# Patient Record
Sex: Female | Born: 1935 | Race: Black or African American | Hispanic: No | Smoking: Former smoker
Health system: Southern US, Community
[De-identification: ages and names within clinical notes are randomized; demographics above are authoritative.]

## PROBLEM LIST (undated history)

## (undated) DIAGNOSIS — H9190 Unspecified hearing loss, unspecified ear: Secondary | ICD-10-CM

## (undated) DIAGNOSIS — I10 Essential (primary) hypertension: Secondary | ICD-10-CM

## (undated) DIAGNOSIS — R7303 Prediabetes: Secondary | ICD-10-CM

## (undated) DIAGNOSIS — F0390 Unspecified dementia without behavioral disturbance: Secondary | ICD-10-CM

## (undated) DIAGNOSIS — H547 Unspecified visual loss: Secondary | ICD-10-CM

## (undated) HISTORY — PX: OTHER SURGICAL HISTORY: SHX169

## (undated) HISTORY — PX: KNEE SURGERY: SHX244

## (undated) HISTORY — PX: ABDOMINAL HYSTERECTOMY: SHX81

## (undated) HISTORY — PX: CARPAL TUNNEL RELEASE: SHX101

---

## 2018-06-21 ENCOUNTER — Emergency Department (HOSPITAL_BASED_OUTPATIENT_CLINIC_OR_DEPARTMENT_OTHER): Payer: Medicare Other

## 2018-06-21 ENCOUNTER — Encounter (HOSPITAL_BASED_OUTPATIENT_CLINIC_OR_DEPARTMENT_OTHER): Payer: Self-pay

## 2018-06-21 ENCOUNTER — Other Ambulatory Visit: Payer: Self-pay

## 2018-06-21 ENCOUNTER — Emergency Department (HOSPITAL_BASED_OUTPATIENT_CLINIC_OR_DEPARTMENT_OTHER)
Admission: EM | Admit: 2018-06-21 | Discharge: 2018-06-22 | Disposition: A | Discharge status: Home / Self Care | Payer: Medicare Other | Attending: Emergency Medicine | Admitting: Emergency Medicine

## 2018-06-21 DIAGNOSIS — Z87891 Personal history of nicotine dependence: Secondary | ICD-10-CM | POA: Diagnosis not present

## 2018-06-21 DIAGNOSIS — S43005A Unspecified dislocation of left shoulder joint, initial encounter: Secondary | ICD-10-CM

## 2018-06-21 DIAGNOSIS — F0390 Unspecified dementia without behavioral disturbance: Secondary | ICD-10-CM | POA: Diagnosis not present

## 2018-06-21 DIAGNOSIS — Y939 Activity, unspecified: Secondary | ICD-10-CM | POA: Diagnosis not present

## 2018-06-21 DIAGNOSIS — S4982XA Other specified injuries of left shoulder and upper arm, initial encounter: Secondary | ICD-10-CM | POA: Diagnosis present

## 2018-06-21 DIAGNOSIS — Y929 Unspecified place or not applicable: Secondary | ICD-10-CM | POA: Insufficient documentation

## 2018-06-21 DIAGNOSIS — Y999 Unspecified external cause status: Secondary | ICD-10-CM | POA: Insufficient documentation

## 2018-06-21 DIAGNOSIS — W1830XA Fall on same level, unspecified, initial encounter: Secondary | ICD-10-CM | POA: Diagnosis not present

## 2018-06-21 DIAGNOSIS — I10 Essential (primary) hypertension: Secondary | ICD-10-CM | POA: Diagnosis not present

## 2018-06-21 HISTORY — DX: Essential (primary) hypertension: I10

## 2018-06-21 HISTORY — DX: Unspecified visual loss: H54.7

## 2018-06-21 HISTORY — DX: Unspecified dementia, unspecified severity, without behavioral disturbance, psychotic disturbance, mood disturbance, and anxiety: F03.90

## 2018-06-21 HISTORY — DX: Unspecified hearing loss, unspecified ear: H91.90

## 2018-06-21 MED ORDER — ETOMIDATE 2 MG/ML IV SOLN
10.0000 mg | Freq: Once | INTRAVENOUS | Status: AC
  Administered 2018-06-21: 10 mg via INTRAVENOUS
  Filled 2018-06-21: qty 10

## 2018-06-21 MED ORDER — MIDAZOLAM HCL 2 MG/2ML IJ SOLN
1.0000 mg | Freq: Once | INTRAMUSCULAR | Status: DC
  Filled 2018-06-21: qty 2

## 2018-06-21 NOTE — ED Provider Notes (Signed)
[facility] EMERGENCY DEPARTMENT Provider Note   CSN: [id number] Arrival date & time: 06/21/18  1959     History   Chief Complaint Chief Complaint  Patient presents with  . Shoulder Injury    HPI Misty Ray is a 84 y.o. female.  Pt presents to the ED today from urgent care with a dislocated left shoulder.  The pt has dementia and is nonverbal.  History per daughter.  Pt fell around 0500 today.  The daughter noticed that pt was not moving the shoulder like normal and went to UC.  They took Xrays and attempted reduction but were unsuccessful.  So, they sent her here.     Past Medical History:  Diagnosis Date  . Blind   . Dementia   . Hearing loss   . Hypertension     There are no active problems to display for this patient.   Past Surgical History:  Procedure Laterality Date  . ABDOMINAL HYSTERECTOMY    . hip sugery    . KNEE SURGERY       OB History   None      Home Medications    Prior to Admission medications   Not on File    Family History No family history on file.  Social History Social History   Tobacco Use  . Smoking status: Former Games developer  . Smokeless tobacco: Never Used  Substance Use Topics  . Alcohol use: Never    Frequency: Never  . Drug use: Never     Allergies   Cortisone   Review of Systems Review of Systems  Musculoskeletal:       Left shoulder  All other systems reviewed and are negative.    Physical Exam Updated Vital Signs BP (!) 215/95 (BP Location: Right Arm)   Pulse 80   Temp 98.5 F (36.9 C) (Oral)   Resp 20   Ht 4\' 9"  (1.448 m)   Wt 49.9 kg (110 lb)   SpO2 100%   BMI 23.80 kg/m   Physical Exam  Constitutional: She appears well-developed and well-nourished.  HENT:  Head: Normocephalic and atraumatic.  Right Ear: External ear normal.  Left Ear: External ear normal.  Nose: Nose normal.  Mouth/Throat: Oropharynx is clear and moist.  Eyes: Pupils are equal, round, and reactive to  light. Conjunctivae and EOM are normal.  Neck: Normal range of motion. Neck supple.  Cardiovascular: Normal rate, regular rhythm, normal heart sounds and intact distal pulses.  Pulmonary/Chest: Effort normal and breath sounds normal.  Abdominal: Soft. Bowel sounds are normal.  Musculoskeletal:       Left shoulder: She exhibits decreased range of motion.  Neurological: She is alert.  Skin: Skin is warm. Capillary refill takes less than 2 seconds.  Psychiatric: She has a normal mood and affect.  Nursing note and vitals reviewed.    ED Treatments / Results  Labs (all labs ordered are listed, but only abnormal results are displayed) Labs Reviewed - No data to display  EKG None  Radiology Dg Shoulder Left  Result Date: 06/21/2018 CLINICAL DATA:  Left shoulder dislocation after fall today. Alzheimer's disease EXAM: LEFT SHOULDER - 2+ VIEW COMPARISON:  None. FINDINGS: There is anterior inferior dislocation of the right humeral head with suspected glenoid. Do not see a definite bony Bankart fracture although there may well be a mild Hill-Sachs impaction. Spurring of the humeral head observed. Atherosclerotic calcification of the aortic arch. IMPRESSION: 1. Anterior inferior dislocation of the left shoulder. Possible  Hill-Sachs impaction along the humeral head. 2.  Aortic Atherosclerosis (ICD10-I70.0). Electronically Signed   By: Gaylyn RongWalter  Liebkemann M.D.   On: 06/21/2018 20:52   Dg Shoulder Left Portable  Result Date: 06/21/2018 CLINICAL DATA:  Reduction of left shoulder. EXAM: LEFT SHOULDER - 1 VIEW COMPARISON:  06/21/2018 FINDINGS: Anterior subcoracoid humeral head dislocation is still present. No bony Bankart lesion. Stable appearance of the [facility] joint without dislocation. Atherosclerosis the adjacent aortic arch with ectasia. No acute osseous abnormality of the included ribs. IMPRESSION: Redemonstration of anterior subcoracoid humeral head dislocation after reduction attempt. Electronically  Signed   By: Tollie Ethavid  Kwon M.D.   On: 06/21/2018 22:25    Procedures .Sedation Date/Time: 06/21/2018 10:55 PM Performed by: Jacalyn LefevreHaviland, Omaree Fuqua, MD Authorized by: Jacalyn LefevreHaviland, Chanele Douglas, MD   Consent:    Consent obtained:  Written   Consent given by:  Guardian   Risks discussed:  Allergic reaction, prolonged hypoxia resulting in organ damage, dysrhythmia, prolonged sedation necessitating reversal, inadequate sedation, respiratory compromise necessitating ventilatory assistance and intubation, nausea and vomiting   Alternatives discussed:  Analgesia without sedation Universal protocol:    Immediately prior to procedure a time out was called: yes     Patient identity confirmation method:  Arm band Indications:    Procedure performed:  Dislocation reduction   Procedure necessitating sedation performed by:  Physician performing sedation   Intended level of sedation:  Moderate (conscious sedation) Pre-sedation assessment:    Time since last food or drink:  5   ASA classification: class 2 - patient with mild systemic disease     Mallampati score:  I - soft palate, uvula, fauces, pillars visible   Pre-sedation assessments completed and reviewed: airway patency   Immediate pre-procedure details:    Reassessment: Patient reassessed immediately prior to procedure     Reviewed: vital signs     Verified: bag valve mask available, emergency equipment available, intubation equipment available, IV patency confirmed, oxygen available, reversal medications available and suction available   Procedure details (see MAR for exact dosages):    Preoxygenation:  Nasal cannula   Sedation:  Etomidate   Intra-procedure monitoring:  Blood pressure monitoring, cardiac monitor, continuous capnometry, continuous pulse oximetry, frequent LOC assessments and frequent vital sign checks   Intra-procedure events: none     Total Provider sedation time (minutes):  30 Post-procedure details:    Post-sedation assessment completed:   06/21/2018 10:56 PM   Attendance: Constant attendance by certified staff until patient recovered     Recovery: Patient returned to pre-procedure baseline     Post-sedation assessments completed and reviewed: airway patency, cardiovascular function, hydration status, mental status, nausea/vomiting, pain level, respiratory function and temperature     Patient is stable for discharge or admission: yes     Patient tolerance:  Tolerated well, no immediate complications Reduction of dislocation Date/Time: 06/21/2018 10:57 PM Performed by: Jacalyn LefevreHaviland, Thayer Embleton, MD Authorized by: Jacalyn LefevreHaviland, Elnoria Livingston, MD  Consent: Written consent obtained. Risks and benefits: risks, benefits and alternatives were discussed Consent given by: guardian Patient identity confirmed: arm band Time out: Immediately prior to procedure a "time out" was called to verify the correct patient, procedure, equipment, support staff and site/side marked as required. Local anesthesia used: no  Anesthesia: Local anesthesia used: no  Sedation: Patient sedated: yes Sedation type: moderate (conscious) sedation Sedatives: etomidate Vitals: Vital signs were monitored during sedation.  Patient tolerance: Patient tolerated the procedure well with no immediate complications Comments: Pt's shoulder would not stay in place.    (including  critical care time)  Medications Ordered in ED Medications  midazolam (VERSED) injection 1 mg (has no administration in time range)  etomidate (AMIDATE) injection 10 mg (10 mg Intravenous Given 06/21/18 2202)     Initial Impression / Assessment and Plan / ED Course  I have reviewed the triage vital signs and the nursing notes.  Pertinent labs & imaging results that were available during my care of the patient were reviewed by me and considered in my medical decision making (see chart for details).  Pt's shoulder has been out since at least 0500.  Maybe before then, but that is unclear.  Pt does not have a  lot of swelling and does not seem to have any pain.  I spoke with Dr. August Saucer (ortho) who will f/u with pt in the office on Friday.  Her daughter is going to talk to other siblings to determine if they are interested in surgery or not.  Pt instructed to return if worse.  Final Clinical Impressions(s) / ED Diagnoses   Final diagnoses:  Shoulder dislocation, left, initial encounter    ED Discharge Orders    None       Jacalyn Lefevre, MD 06/21/18 2300

## 2018-06-21 NOTE — ED Triage Notes (Signed)
Per daughter pt fell approx 5am-[facility]xray done and was notified shoulder is dislocated and advised to come to ED-pt to triage in own w/c-hx of dementia, nonverbal at this time-NAD

## 2018-06-21 NOTE — ED Notes (Signed)
Patient at mental status baseline is nonverbal due to dementia. Patient nonambulatory at baseline.

## 2018-06-22 MED ORDER — ETOMIDATE 2 MG/ML IV SOLN
INTRAVENOUS | Status: AC | PRN
  Administered 2018-06-21: 5 mg via INTRAVENOUS

## 2018-06-22 NOTE — Sedation Documentation (Signed)
Dr Particia NearingHaviland at bedside; per repeat xray left shoulder not reduced; order received for Etomidate 5mg  IVP now; RT called to bedside. Patient to be re-sedated for shoulder reduction.

## 2018-06-22 NOTE — Sedation Documentation (Signed)
Patient is at baseline for aldrete score due to advanced dementia.

## 2018-06-23 ENCOUNTER — Ambulatory Visit (INDEPENDENT_AMBULATORY_CARE_PROVIDER_SITE_OTHER): Payer: Medicare Other | Admitting: Orthopedic Surgery

## 2018-06-23 ENCOUNTER — Encounter (INDEPENDENT_AMBULATORY_CARE_PROVIDER_SITE_OTHER): Payer: Self-pay | Admitting: Orthopedic Surgery

## 2018-06-23 DIAGNOSIS — S43005A Unspecified dislocation of left shoulder joint, initial encounter: Secondary | ICD-10-CM | POA: Diagnosis not present

## 2018-06-23 DIAGNOSIS — S43006A Unspecified dislocation of unspecified shoulder joint, initial encounter: Secondary | ICD-10-CM

## 2018-06-23 NOTE — Progress Notes (Signed)
Office Visit Note   Patient: Gasper SellsJeanette Ledford           Date of Birth: 1925-05-11           MRN: [id number] Visit Date: 06/23/2018 Requested by: Zoila ShutterWoodyear, Wynne E, MD [address] [place], [place] [address] PCP: Zoila ShutterWoodyear, Wynne E, MD  Subjective: Chief Complaint  Patient presents with  . Left Shoulder - Pain    HPI: Para MarchJeanette is a patient with left shoulder pain.  She has dementia.  Injured herself 06/21/2018.  Shoulder dislocation at the time was attempted to be reduced in the ER with sedation but it would reduce and then become dislocated again.  Patient is right-hand dominant.  She is with her 2 daughters today who are her primary caregivers.  She does have end-stage dementia.              ROS: ROS not possible  A assessment & Plan: Visit Diagnoses:  1. Dislocation of shoulder joint     Plan: Impression is left shoulder dislocation with possible obstruction to reduction.  Radial pulse intact today.  Had a long talk with patient's family today about operative and nonoperative treatment options for this.  In general they are averse to operative treatment.  Nonetheless they may wantHave some attempt at getting the shoulder reduced.  Based on my conversation with the emergency room physician it seems that there could be an obstruction to reduction.  I would get an MRI scan today or tomorrow and then plan on surgery either Monday or Tuesday for closed reduction.  She is likely torn significant portion of her rotator cuff.  She may have the biceps interposed which is preventing reduction.    Follow-Up Instructions: No follow-ups on file.   Orders:  Orders Placed This Encounter  Procedures  . MR Shoulder Left w/o contrast   No orders of the defined types were placed in this encounter.     Procedures: No procedures performed   Clinical Data: No additional findings.  Objective: Vital Signs: There were no vitals taken for this visit.  Physical  Exam:  Constitutional: Patient appears well-developed HEENT:  Head: Normocephalic  Neck: Normal range of motion Cardiovascular: Normal rate Pulmonary/chest: Effort normal Neurologic: Patient is not alert Skin: Skin is warm Psychiatric: Patient has dementia.   Ortho Exam: Ortho examination demonstrates not much in the way of pain with range of motion hips or knees.  Left shoulder is dislocated and deltoid function difficult to assess but the axillary nerve is likely out.  She does not do much in terms of following voluntary commands.  She does move the hand on the left-hand side.  Specialty Comments:  No specialty comments available.  Imaging: No results found.   PMFS History: There are no active problems to display for this patient.  Past Medical History:  Diagnosis Date  . Blind   . Dementia   . Hearing loss   . Hypertension     History reviewed. No pertinent family history.  Past Surgical History:  Procedure Laterality Date  . ABDOMINAL HYSTERECTOMY    . hip sugery    . KNEE SURGERY     Social History   Occupational History  . Not on file  Tobacco Use  . Smoking status: Former Games developermoker  . Smokeless tobacco: Never Used  Substance and Sexual Activity  . Alcohol use: Never    Frequency: Never  . Drug use: Never  . Sexual activity: Not on file

## 2018-06-24 ENCOUNTER — Ambulatory Visit (HOSPITAL_BASED_OUTPATIENT_CLINIC_OR_DEPARTMENT_OTHER)
Admission: RE | Admit: 2018-06-24 | Discharge: 2018-06-24 | Disposition: A | Discharge status: Home / Self Care | Payer: Medicare Other | Source: Ambulatory Visit | Attending: Orthopedic Surgery | Admitting: Orthopedic Surgery

## 2018-06-24 DIAGNOSIS — S43006A Unspecified dislocation of unspecified shoulder joint, initial encounter: Secondary | ICD-10-CM

## 2018-06-24 DIAGNOSIS — S43005A Unspecified dislocation of left shoulder joint, initial encounter: Secondary | ICD-10-CM | POA: Diagnosis present

## 2018-06-24 DIAGNOSIS — X58XXXA Exposure to other specified factors, initial encounter: Secondary | ICD-10-CM | POA: Diagnosis not present

## 2018-06-26 ENCOUNTER — Encounter (HOSPITAL_COMMUNITY): Payer: Self-pay

## 2018-06-26 ENCOUNTER — Telehealth (INDEPENDENT_AMBULATORY_CARE_PROVIDER_SITE_OTHER): Payer: Self-pay | Admitting: Orthopedic Surgery

## 2018-06-26 NOTE — Progress Notes (Signed)
Spoke to patients daughter Royston Cowper(Donise Glockner) on the phone. Gave daughter [phone] tomorrow for mothers surgery.

## 2018-06-26 NOTE — Telephone Encounter (Signed)
Patient's daughter called stating that her mother had an MRI on Saturday, July 20th.  I advised her that we would make an appointment for Dr. August Saucerean to go over the results with her.  The daughter is concerned about waiting until August 1st to get the results.  She is wanting to know if there is any way that we can get the patient in sooner.   Patient's Daughter's [phone].  Thank you.

## 2018-06-26 NOTE — Telephone Encounter (Signed)
Please advise. I believe you had mentioned earlier about calling them with results. Thanks.

## 2018-06-26 NOTE — Telephone Encounter (Signed)
I called and discussed the findings with her.  We decided on a plan of sedation with attempt at closed reduction with possible open reduction if that is not successful.  The daughter states that the mother is grimacing more and appears to be in more pain than she was before.  Risk and benefits are discussed.  I will discuss them further tomorrow when I see them for surgery.

## 2018-06-27 ENCOUNTER — Encounter (HOSPITAL_COMMUNITY)
Admission: RE | Disposition: A | Discharge status: Home / Self Care | Payer: Self-pay | Source: Ambulatory Visit | Attending: Orthopedic Surgery

## 2018-06-27 ENCOUNTER — Inpatient Hospital Stay (HOSPITAL_COMMUNITY): Payer: Medicare Other | Admitting: Anesthesiology

## 2018-06-27 ENCOUNTER — Ambulatory Visit (HOSPITAL_COMMUNITY)
Admission: RE | Admit: 2018-06-27 | Discharge: 2018-06-27 | Disposition: A | Discharge status: Home / Self Care | Payer: Medicare Other | Source: Ambulatory Visit | Attending: Orthopedic Surgery | Admitting: Orthopedic Surgery

## 2018-06-27 ENCOUNTER — Encounter (HOSPITAL_COMMUNITY): Payer: Self-pay | Admitting: Urology

## 2018-06-27 ENCOUNTER — Ambulatory Visit (HOSPITAL_COMMUNITY): Payer: Medicare Other

## 2018-06-27 DIAGNOSIS — W19XXXA Unspecified fall, initial encounter: Secondary | ICD-10-CM | POA: Insufficient documentation

## 2018-06-27 DIAGNOSIS — H9190 Unspecified hearing loss, unspecified ear: Secondary | ICD-10-CM | POA: Insufficient documentation

## 2018-06-27 DIAGNOSIS — S4292XD Fracture of left shoulder girdle, part unspecified, subsequent encounter for fracture with routine healing: Secondary | ICD-10-CM | POA: Diagnosis not present

## 2018-06-27 DIAGNOSIS — Z87891 Personal history of nicotine dependence: Secondary | ICD-10-CM | POA: Diagnosis not present

## 2018-06-27 DIAGNOSIS — G309 Alzheimer's disease, unspecified: Secondary | ICD-10-CM | POA: Insufficient documentation

## 2018-06-27 DIAGNOSIS — Z419 Encounter for procedure for purposes other than remedying health state, unspecified: Secondary | ICD-10-CM

## 2018-06-27 DIAGNOSIS — Z791 Long term (current) use of non-steroidal anti-inflammatories (NSAID): Secondary | ICD-10-CM | POA: Insufficient documentation

## 2018-06-27 DIAGNOSIS — Z79899 Other long term (current) drug therapy: Secondary | ICD-10-CM | POA: Insufficient documentation

## 2018-06-27 DIAGNOSIS — H547 Unspecified visual loss: Secondary | ICD-10-CM | POA: Diagnosis not present

## 2018-06-27 DIAGNOSIS — S46012A Strain of muscle(s) and tendon(s) of the rotator cuff of left shoulder, initial encounter: Secondary | ICD-10-CM | POA: Insufficient documentation

## 2018-06-27 DIAGNOSIS — S4290XD Fracture of unspecified shoulder girdle, part unspecified, subsequent encounter for fracture with routine healing: Secondary | ICD-10-CM

## 2018-06-27 DIAGNOSIS — S43005A Unspecified dislocation of left shoulder joint, initial encounter: Secondary | ICD-10-CM | POA: Diagnosis present

## 2018-06-27 DIAGNOSIS — F0280 Dementia in other diseases classified elsewhere without behavioral disturbance: Secondary | ICD-10-CM | POA: Diagnosis not present

## 2018-06-27 DIAGNOSIS — I10 Essential (primary) hypertension: Secondary | ICD-10-CM | POA: Diagnosis not present

## 2018-06-27 HISTORY — PX: SHOULDER CLOSED REDUCTION: SHX1051

## 2018-06-27 HISTORY — DX: Prediabetes: R73.03

## 2018-06-27 LAB — CBC
HCT: 47.5 % — ABNORMAL HIGH (ref 36.0–46.0)
Hemoglobin: 15 g/dL (ref 12.0–15.0)
MCH: 31.8 pg (ref 26.0–34.0)
MCHC: 31.6 g/dL (ref 30.0–36.0)
MCV: 100.8 fL — AB (ref 78.0–100.0)
PLATELETS: 241 10*3/uL (ref 150–400)
RBC: 4.71 MIL/uL (ref 3.87–5.11)
RDW: 13.1 % (ref 11.5–15.5)
WBC: 5.3 10*3/uL (ref 4.0–10.5)

## 2018-06-27 LAB — BASIC METABOLIC PANEL
ANION GAP: 10 (ref 5–15)
BUN: 10 mg/dL (ref 8–23)
CO2: 24 mmol/L (ref 22–32)
Calcium: 8.9 mg/dL (ref 8.9–10.3)
Chloride: 105 mmol/L (ref 98–111)
Creatinine, Ser: 0.59 mg/dL (ref 0.44–1.00)
GFR calc Af Amer: >60 mL/min (ref >60)
Glucose, Bld: 102 mg/dL — ABNORMAL HIGH (ref 70–99)
Potassium: 4.3 mmol/L (ref 3.5–5.1)
SODIUM: 139 mmol/L (ref 135–145)

## 2018-06-27 LAB — GLUCOSE, CAPILLARY
GLUCOSE-CAPILLARY: 109 mg/dL — AB (ref 70–99)
GLUCOSE-CAPILLARY: 101 mg/dL — AB (ref 70–99)

## 2018-06-27 SURGERY — CLOSED REDUCTION, SHOULDER
Anesthesia: General | Laterality: Left

## 2018-06-27 MED ORDER — GLYCOPYRROLATE 0.2 MG/ML IJ SOLN: INTRAMUSCULAR | Status: DC | PRN

## 2018-06-27 MED ORDER — ONDANSETRON HCL 4 MG/2ML IJ SOLN
INTRAMUSCULAR | Status: DC | PRN
  Administered 2018-06-27: 4 mg via INTRAVENOUS

## 2018-06-27 MED ORDER — PHENYLEPHRINE HCL 10 MG/ML IJ SOLN
INTRAMUSCULAR | Status: DC | PRN
  Administered 2018-06-27: 80 ug via INTRAVENOUS
  Administered 2018-06-27: 120 ug via INTRAVENOUS
  Administered 2018-06-27: 160 ug via INTRAVENOUS

## 2018-06-27 MED ORDER — PROPOFOL 10 MG/ML IV BOLUS
INTRAVENOUS | Status: AC
  Filled 2018-06-27: qty 20

## 2018-06-27 MED ORDER — FENTANYL CITRATE (PF) 100 MCG/2ML IJ SOLN
INTRAMUSCULAR | Status: DC | PRN
  Administered 2018-06-27: 50 ug via INTRAVENOUS

## 2018-06-27 MED ORDER — PROPOFOL 10 MG/ML IV BOLUS
INTRAVENOUS | Status: DC | PRN
  Administered 2018-06-27: 100 mg via INTRAVENOUS

## 2018-06-27 MED ORDER — SUGAMMADEX SODIUM 500 MG/5ML IV SOLN
INTRAVENOUS | Status: DC | PRN
  Administered 2018-06-27: 500 mg via INTRAVENOUS

## 2018-06-27 MED ORDER — ROCURONIUM BROMIDE 10 MG/ML (PF) SYRINGE
PREFILLED_SYRINGE | INTRAVENOUS | Status: AC
  Filled 2018-06-27: qty 10

## 2018-06-27 MED ORDER — SUCCINYLCHOLINE CHLORIDE 200 MG/10ML IV SOSY
PREFILLED_SYRINGE | INTRAVENOUS | Status: AC
  Filled 2018-06-27: qty 10

## 2018-06-27 MED ORDER — ATROPINE SULFATE 0.4 MG/ML IV SOSY
PREFILLED_SYRINGE | INTRAVENOUS | Status: DC | PRN
  Administered 2018-06-27: .4 mg via INTRAVENOUS

## 2018-06-27 MED ORDER — EPHEDRINE SULFATE 50 MG/ML IJ SOLN
INTRAMUSCULAR | Status: DC | PRN
  Administered 2018-06-27: 10 mg via INTRAVENOUS

## 2018-06-27 MED ORDER — FENTANYL CITRATE (PF) 100 MCG/2ML IJ SOLN: 25.0000 ug | INTRAMUSCULAR | Status: DC | PRN

## 2018-06-27 MED ORDER — ONDANSETRON HCL 4 MG/2ML IJ SOLN
INTRAMUSCULAR | Status: AC
  Filled 2018-06-27: qty 2

## 2018-06-27 MED ORDER — LACTATED RINGERS IV SOLN
INTRAVENOUS | Status: DC
  Administered 2018-06-27 (×2): via INTRAVENOUS

## 2018-06-27 MED ORDER — CEFAZOLIN SODIUM-DEXTROSE 2-4 GM/100ML-% IV SOLN
INTRAVENOUS | Status: AC
  Filled 2018-06-27: qty 100

## 2018-06-27 MED ORDER — LIDOCAINE HCL (CARDIAC) PF 100 MG/5ML IV SOSY
PREFILLED_SYRINGE | INTRAVENOUS | Status: DC | PRN
  Administered 2018-06-27: 100 mg via INTRAVENOUS

## 2018-06-27 MED ORDER — MEPERIDINE HCL 50 MG/ML IJ SOLN: 6.2500 mg | INTRAMUSCULAR | Status: DC | PRN

## 2018-06-27 MED ORDER — METOCLOPRAMIDE HCL 5 MG/ML IJ SOLN: 10.0000 mg | Freq: Once | INTRAMUSCULAR | Status: DC | PRN

## 2018-06-27 MED ORDER — PHENYLEPHRINE 40 MCG/ML (10ML) SYRINGE FOR IV PUSH (FOR BLOOD PRESSURE SUPPORT)
PREFILLED_SYRINGE | INTRAVENOUS | Status: AC
  Filled 2018-06-27: qty 10

## 2018-06-27 MED ORDER — DEXAMETHASONE SODIUM PHOSPHATE 10 MG/ML IJ SOLN
INTRAMUSCULAR | Status: AC
  Filled 2018-06-27: qty 1

## 2018-06-27 MED ORDER — FENTANYL CITRATE (PF) 250 MCG/5ML IJ SOLN
INTRAMUSCULAR | Status: AC
  Filled 2018-06-27: qty 5

## 2018-06-27 MED ORDER — SUGAMMADEX SODIUM 200 MG/2ML IV SOLN
INTRAVENOUS | Status: AC
  Filled 2018-06-27: qty 2

## 2018-06-27 MED ORDER — ROCURONIUM BROMIDE 100 MG/10ML IV SOLN
INTRAVENOUS | Status: DC | PRN
  Administered 2018-06-27: 60 mg via INTRAVENOUS

## 2018-06-27 SURGICAL SUPPLY — 1 items: SLING ARM IMMOBILIZER MED (SOFTGOODS) ×3 IMPLANT

## 2018-06-27 NOTE — Anesthesia Preprocedure Evaluation (Signed)
Anesthesia Evaluation  Patient identified by MRN, date of birth, ID band Patient awake, Patient confused and Patient unresponsive    Reviewed: Allergy & Precautions, NPO status , Patient's Chart, lab work & pertinent test results  Airway Mallampati: II  TM Distance: >3 FB Neck ROM: Full    Dental no notable dental hx.    Pulmonary neg pulmonary ROS, former smoker,    Pulmonary exam normal breath sounds clear to auscultation       Cardiovascular hypertension, Pt. on medications Normal cardiovascular exam Rhythm:Regular Rate:Normal     Neuro/Psych Dementia negative neurological ROS     GI/Hepatic negative GI ROS, Neg liver ROS,   Endo/Other  negative endocrine ROS  Renal/GU negative Renal ROS  negative genitourinary   Musculoskeletal negative musculoskeletal ROS (+)   Abdominal   Peds negative pediatric ROS (+)  Hematology negative hematology ROS (+)   Anesthesia Other Findings   Reproductive/Obstetrics negative OB ROS                             Anesthesia Physical Anesthesia Plan  ASA: III  Anesthesia Plan: General   Post-op Pain Management:    Induction: Intravenous  PONV Risk Score and Plan: 3 and Ondansetron and Treatment may vary due to age or medical condition  Airway Management Planned: Oral ETT  Additional Equipment:   Intra-op Plan:   Post-operative Plan: Extubation in OR  Informed Consent: I have reviewed the patients History and Physical, chart, labs and discussed the procedure including the risks, benefits and alternatives for the proposed anesthesia with the patient or authorized representative who has indicated his/her understanding and acceptance.   Dental advisory given  Plan Discussed with: CRNA  Anesthesia Plan Comments:         Anesthesia Quick Evaluation

## 2018-06-27 NOTE — Brief Op Note (Signed)
06/27/2018  1:44 PM  PATIENT:  Misty Ray  84 y.o. female  PRE-OPERATIVE DIAGNOSIS:  LEFT SHOULDER DISLOCATION  POST-OPERATIVE DIAGNOSIS:  LEFT SHOULDER DISLOCATION  PROCEDURE:  Procedure(s): LEFT SHOULDER CLOSED REDUCTION  SURGEON:  Surgeon(s): August Saucerean, Corrie MckusickGregory Scott, MD  ASSISTANT: Patrick Jupiterarla Bethune rnfa  ANESTHESIA:   general  EBL: 0 ml    No intake/output data recorded.  BLOOD ADMINISTERED: none  DRAINS: none   LOCAL MEDICATIONS USED:  none  SPECIMEN:  No Specimen  COUNTS:  YES  TOURNIQUET:  * No tourniquets in log *  DICTATION: .Other Dictation: Dictation Number [id number]  PLAN OF CARE: Discharge to home after PACU  PATIENT DISPOSITION:  PACU - hemodynamically stable

## 2018-06-27 NOTE — Transfer of Care (Signed)
Immediate Anesthesia Transfer of Care Note  Patient: Misty Ray  Procedure(s) Performed: LEFT SHOULDER CLOSED REDUCTION (Left )  Patient Location: PACU  Anesthesia Type:General  Level of Consciousness: drowsy and confused  Airway & Oxygen Therapy: Patient Spontanous Breathing and Patient connected to nasal cannula oxygen  Post-op Assessment: Report given to RN and Post -op Vital signs reviewed and stable  Post vital signs: Reviewed and stable  Last Vitals:  Vitals Value Taken Time  BP 140/76 06/27/2018  2:01 PM  Temp 36.3 C 06/27/2018  2:01 PM  Pulse    Resp 13 06/27/2018  2:09 PM  SpO2 99 % 06/27/2018  2:01 PM  Vitals shown include unvalidated device data.  Last Pain:  Vitals:   06/27/18 1044  TempSrc: Axillary      Patients Stated Pain Goal: 0 (06/27/18 1058)  Complications: No apparent anesthesia complications

## 2018-06-27 NOTE — Op Note (Signed)
NAME: Misty Ray, Misty S. MEDICAL RECORD [id number] ACCOUNT [id number] DATE OF BIRTH:05-03-1925 FACILITY: [facility] LOCATION: [facility] PHYSICIAN:GREGORY Diamantina ProvidenceS. DEAN, MD  OPERATIVE REPORT  DATE OF PROCEDURE:  06/27/2018  PREOPERATIVE DIAGNOSIS:  Left shoulder dislocation.  POSTOPERATIVE DIAGNOSIS:  Left shoulder dislocation.  PROCEDURES:  Left shoulder closed reduction with evaluation and interpretation of fluoroscopic imaging.  SURGEON/ATTENDING:  Dorene GrebeScott Dean, MD.  ASSISTANT:  Misty Ray RNFA.    INDICATIONS:  Misty Ray is a 84 year old female with left shoulder dislocation about 4 days out.  Presents now for operative management after explanation of risks and benefits.    PROCEDURE IN DETAIL:  The patient was brought to the operating room where general anesthetic was induced.  Preoperative antibiotics administered.  Timeout was called.  With the patient under muscle relaxation, the left shoulder was examined.  Using a  combination of manipulation, traction and internal and external rotation.  The shoulder was reduced.  The shoulder was stable to reduction up to external rotation of about 50 degrees and abduction to about 90 degrees.  The patient by MRI scan had  significant rotator cuff pathology.  We were able to get a concentric reduction, which was confirmed on the fluoroscopic planes.  Following this, the stability was assessed and it was found to be stable for sling immobilization.  Following sling  immobilization, the patient tolerated the procedure well without immediate complications.  She was transferred to the recovery room in stable condition.    She will follow up in clinic in 1 week.  AN/NUANCE  D:06/27/2018 T:06/27/2018 JOB:[id number]

## 2018-06-27 NOTE — H&P (Signed)
Misty Ray is an 84 y.o. female.   Chief Complaint: Left shoulder pain HPI: Misty Ray is a demented patient with left shoulder pain.  She had a fall 5 days ago.  She sustained a left shoulder injury it is believed at that time.  She was seen in the emergency room in [facility] where an attempt at reduction was made but the reduction would not remain reduced in terms of the left shoulder.  She subsequently was seen in the clinic.  Had a discussion at that time with her daughters regarding whether or not interventional care should be performed.  At that time the patient was relatively asymptomatic but the decision was to potentially try to obtain a reduction under anesthesia.  To that end MRI scan was obtained which showed possible obstruction of the glenohumeral joint from intervening rotator cuff.  Since that time the patient has become more painful and developed more swelling in that left arm.  Past Medical History:  Diagnosis Date  . Blind   . Dementia    Alzheimer's  . Hearing loss   . Hypertension   . Pre-diabetes     Past Surgical History:  Procedure Laterality Date  . ABDOMINAL HYSTERECTOMY    . CARPAL TUNNEL RELEASE Right   . hip sugery    . KNEE SURGERY      History reviewed. No pertinent family history. Social History:  reports that she has quit smoking. She has never used smokeless tobacco. She reports that she does not drink alcohol or use drugs.  Allergies:  Allergies  Allergen Reactions  . Cortisone Rash and Other (See Comments)    inj made pain worse Made knee pain worse after injection     Medications Prior to Admission  Medication Sig Dispense Refill  . cyclopentolate (CYCLODRYL,CYCLOGYL) 1 % ophthalmic solution Place 1 drop into both eyes daily.     Marland Kitchen donepezil (ARICEPT) 5 MG tablet Take 5 mg by mouth at bedtime.    Marland Kitchen ibuprofen (ADVIL,MOTRIN) 200 MG tablet Take 200 mg by mouth every 4 (four) hours as needed for mild pain.    Marland Kitchen lisinopril  (PRINIVIL,ZESTRIL) 10 MG tablet Take 10 mg by mouth daily.    Marland Kitchen loratadine (CLARITIN) 10 MG tablet Take 10 mg by mouth daily.    . Omega-3 1000 MG CAPS Take 1,000 mg by mouth daily.    Misty Ray Glycol-Propyl Glycol (SYSTANE OP) Place 1 drop into both eyes 4 (four) times daily.    . prednisoLONE acetate (PRED FORTE) 1 % ophthalmic suspension Place 1 drop into both eyes daily.    . vitamin C (ASCORBIC ACID) 500 MG tablet Take 500 mg by mouth daily.      Results for orders placed or performed during the hospital encounter of 06/27/18 (from the past 48 hour(s))  Glucose, capillary     Status: Abnormal   Collection Time: 06/27/18 10:42 AM  Result Value Ref Range   Glucose-Capillary 109 (H) 70 - 99 mg/dL   Comment 1 Notify RN   CBC     Status: Abnormal   Collection Time: 06/27/18 12:05 PM  Result Value Ref Range   WBC 5.3 4.0 - 10.5 K/uL   RBC 4.71 3.87 - 5.11 MIL/uL   Hemoglobin 15.0 12.0 - 15.0 g/dL   HCT 47.5 (H) 36.0 - 46.0 %   MCV 100.8 (H) 78.0 - 100.0 fL   MCH 31.8 26.0 - 34.0 pg   MCHC 31.6 30.0 - 36.0 g/dL  RDW 13.1 11.5 - 15.5 %   Platelets 241 150 - 400 K/uL    Comment: Performed at [facility], [place] [address] [place], [place] [address]  Basic metabolic panel     Status: Abnormal   Collection Time: 06/27/18 12:05 PM  Result Value Ref Range   Sodium 139 135 - 145 mmol/L   Potassium 4.3 3.5 - 5.1 mmol/L   Chloride 105 98 - 111 mmol/L   CO2 24 22 - 32 mmol/L   Glucose, Bld 102 (H) 70 - 99 mg/dL   BUN 10 8 - 23 mg/dL   Creatinine, Ser 0.59 0.44 - 1.00 mg/dL   Calcium 8.9 8.9 - 10.3 mg/dL   GFR calc non Af Amer >60 >60 mL/min   GFR calc Af Amer >60 >60 mL/min    Comment: (NOTE) The eGFR has been calculated using the CKD EPI equation. This calculation has not been validated in all clinical situations. eGFR's persistently <60 mL/min signify possible Chronic Kidney Disease.    Anion gap 10 5 - 15    Comment: Performed at [facility] [address], [place],  [address]  Glucose, capillary     Status: Abnormal   Collection Time: 06/27/18 12:43 PM  Result Value Ref Range   Glucose-Capillary 101 (H) 70 - 99 mg/dL   Comment 1 Notify RN    No results found.  Review of Systems  Unable to perform ROS: Dementia    Blood pressure (!) 182/69, pulse (!) 54, temperature (!) 97.5 F (36.4 C), temperature source Axillary, resp. rate 18, SpO2 99 %. Physical Exam  Constitutional: She appears well-developed.  HENT:  Head: Normocephalic.  Neck: Normal range of motion.  Cardiovascular: Normal rate.  Respiratory: Effort normal.  Skin: Skin is warm.    Left shoulder is examined.  Patient has dislocation.  Radial pulses intact.  Hand function has some function.  Deltoid function difficult to assess because the patient cannot follow commands.  Mild swelling is present in the left arm.  No other evidence of extremity injury to the right upper extremity or bilateral lower extremities.  Assessment/Plan Impression is shoulder dislocation in a demented 84 year old patient.  MRI scan shows rotator cuff tearing of the supraspinatus and infraspinatus which are lying within the glenohumeral joint.  Biceps tendon appears to be anterior and is follows the humeral head.  Plan at this time is an attempt at closed reduction with general anesthetic.  We may need to proceed with open reduction.  Do not anticipate any type of reconstruction due to possible absence of axillary nerve function.  The risk and benefits are discussed with the daughters including but not limited to infection nerve vessel damage persistent instability.  All questions answered.  Their general desire is to try to achieve a reduction with minimal anesthetic.  Anderson Malta, MD 06/27/2018, 12:47 PM

## 2018-06-27 NOTE — Anesthesia Procedure Notes (Signed)
Procedure Name: Intubation Date/Time: 06/27/2018 1:22 PM Performed by: Montez Hageman, MD Pre-anesthesia Checklist: Patient identified, Emergency Drugs available, Patient being monitored, Timeout performed and Suction available Patient Re-evaluated:Patient Re-evaluated prior to induction Oxygen Delivery Method: Circle system utilized Preoxygenation: Pre-oxygenation with 100% oxygen Induction Type: IV induction Ventilation: Mask ventilation without difficulty Laryngoscope Size: Mac and 3 Grade View: Grade III Tube size: 7.0 mm Number of attempts: 2 Airway Equipment and Method: Stylet and Bougie stylet Placement Confirmation: ETT inserted through vocal cords under direct vision,  positive ETCO2 and breath sounds checked- equal and bilateral Secured at: 23 cm Tube secured with: Tape Dental Injury: Teeth and Oropharynx as per pre-operative assessment  Difficulty Due To: Difficult Airway- due to limited oral opening

## 2018-06-28 ENCOUNTER — Encounter (HOSPITAL_COMMUNITY): Payer: Self-pay | Admitting: Orthopedic Surgery

## 2018-06-28 NOTE — Anesthesia Postprocedure Evaluation (Signed)
Anesthesia Post Note  Patient: Misty Ray  Procedure(s) Performed: LEFT SHOULDER CLOSED REDUCTION (Left )     Patient location during evaluation: PACU Anesthesia Type: General Level of consciousness: awake and alert Pain management: pain level controlled Vital Signs Assessment: post-procedure vital signs reviewed and stable Respiratory status: spontaneous breathing, nonlabored ventilation, respiratory function stable and patient connected to nasal cannula oxygen Cardiovascular status: blood pressure returned to baseline and stable Postop Assessment: no apparent nausea or vomiting Anesthetic complications: no    Last Vitals:  Vitals:   06/27/18 1502 06/27/18 1511  BP: 132/71 128/81  Pulse: 60 69  Resp: 13 15  Temp:  (!) 36.3 C  SpO2: 97% 96%    Last Pain:  Vitals:   06/27/18 1044  TempSrc: Axillary                 Phillips Groutarignan, Gabrian Hoque

## 2018-07-03 NOTE — Discharge Summary (Signed)
Physician Discharge Summary  Patient ID: Misty Ray MRN: [id number] DOB/AGE: Mar 10, 1925 84 y.o.  Admit date: 06/27/2018 Discharge date: 06/27/2018  Admission Diagnoses:  Active Problems:   Traumatic closed displaced fracture of shoulder with anterior dislocation with routine healing   Discharge Diagnoses:  Same  Surgeries: Procedure(s): LEFT SHOULDER CLOSED REDUCTION on 06/27/2018   Consultants:   Discharged Condition: Stable  Hospital Course: Misty Ray is an 84 y.o. female who was admitted 06/27/2018 with a chief complaint of left shoulder pain, and found to have a diagnosis of Left shoulder dislocation.  They were brought to the operating room on 06/27/2018 and underwent the above named procedures.  Patient tolerated the procedure well.  Shoulder was relocated and was stable.  Patient placed into a sling and discharged to home in good condition.  She will follow-up with me in a week for repeat radiographs.  She is instructed to stay in the sling for at least 3 weeks.  Antibiotics given:  Anti-infectives (From admission, onward)   Start     Dose/Rate Route Frequency Ordered Stop   06/27/18 1302  ceFAZolin (ANCEF) 2-4 GM/100ML-% IVPB  Status:  Discontinued    Note to Pharmacy:  Lorette Ang, Meredit: cabinet override      06/27/18 1302 06/27/18 1927    .  Recent vital signs:  Vitals:   06/27/18 1502 06/27/18 1511  BP: 132/71 128/81  Pulse: 60 69  Resp: 13 15  Temp:  (!) 97.4 F (36.3 C)  SpO2: 97% 96%    Recent laboratory studies:  Results for orders placed or performed during the hospital encounter of 06/27/18  CBC  Result Value Ref Range   WBC 5.3 4.0 - 10.5 K/uL   RBC 4.71 3.87 - 5.11 MIL/uL   Hemoglobin 15.0 12.0 - 15.0 g/dL   HCT 13.2 (H) 44.0 - 10.2 %   MCV 100.8 (H) 78.0 - 100.0 fL   MCH 31.8 26.0 - 34.0 pg   MCHC 31.6 30.0 - 36.0 g/dL   RDW 72.5 36.6 - 44.0 %   Platelets 241 150 - 400 K/uL  Basic metabolic panel  Result Value Ref Range    Sodium 139 135 - 145 mmol/L   Potassium 4.3 3.5 - 5.1 mmol/L   Chloride 105 98 - 111 mmol/L   CO2 24 22 - 32 mmol/L   Glucose, Bld 102 (H) 70 - 99 mg/dL   BUN 10 8 - 23 mg/dL   Creatinine, Ser 3.47 0.44 - 1.00 mg/dL   Calcium 8.9 8.9 - 42.5 mg/dL   GFR calc non Af Amer >60 >60 mL/min   GFR calc Af Amer >60 >60 mL/min   Anion gap 10 5 - 15  Glucose, capillary  Result Value Ref Range   Glucose-Capillary 109 (H) 70 - 99 mg/dL   Comment 1 Notify RN   Glucose, capillary  Result Value Ref Range   Glucose-Capillary 101 (H) 70 - 99 mg/dL   Comment 1 Notify RN     Discharge Medications:   Allergies as of 06/27/2018      Reactions   Cortisone Rash, Other (See Comments)   inj made pain worse Made knee pain worse after injection      Medication List    TAKE these medications   cyclopentolate 1 % ophthalmic solution Commonly known as:  CYCLODRYL,CYCLOGYL Place 1 drop into both eyes daily.   donepezil 5 MG tablet Commonly known as:  ARICEPT Take 5 mg by mouth at bedtime.  ibuprofen 200 MG tablet Commonly known as:  ADVIL,MOTRIN Take 200 mg by mouth every 4 (four) hours as needed for mild pain.   lisinopril 10 MG tablet Commonly known as:  PRINIVIL,ZESTRIL Take 10 mg by mouth daily.   loratadine 10 MG tablet Commonly known as:  CLARITIN Take 10 mg by mouth daily.   Omega-3 1000 MG Caps Take 1,000 mg by mouth daily.   prednisoLONE acetate 1 % ophthalmic suspension Commonly known as:  PRED FORTE Place 1 drop into both eyes daily.   SYSTANE OP Place 1 drop into both eyes 4 (four) times daily.   vitamin C 500 MG tablet Commonly known as:  ASCORBIC ACID Take 500 mg by mouth daily.       Diagnostic Studies: Mr Shoulder Left W/o Contrast  Result Date: 06/24/2018 CLINICAL DATA:  History of irreducible anterior shoulder dislocation. EXAM: MRI OF THE LEFT SHOULDER WITHOUT CONTRAST TECHNIQUE: Multiplanar, multisequence MR imaging of the shoulder was performed. No  intravenous contrast was administered. COMPARISON:  Radiographs 06/21/2018 FINDINGS: Examination is quite limited due to patient motion. Rotator cuff: The subscapularis and teres major tendons appear to be intact. I believe the supraspinatus and infraspinatus tendon are torn. There appear to be draped across the glenohumeral joint and could potentially be blocking reduction. The teres major tendon also drapes across the inferior aspect of the glenohumeral joint. Muscles:  Extensively torn supraspinatus and infraspinatus muscles. Biceps long head: Difficult to identify the intra-articular portion. Acromioclavicular Joint:  Intact.  Moderate degenerative changes. Glenohumeral Joint: Degenerative changes involving the glenoid. There is debris in the joint along with a large amount of fluid and probable hematoma. I do not see any large bony fragment in the joint. Labrum:  Severely degenerated and likely torn. Bones: The humeral head is dislocated anteriorly. I do not see the glenoid stuck on a large Hill-Sachs defect or other bony cause for blocking reduction. No obvious glenoid fracture. Other: None IMPRESSION: 1. Anterior subcoracoid dislocation of the humeral head. The head does not appear stuck on the anterior glenoid and I do not see an obvious significant Hill-Sachs impaction fracture or glenoid fracture. 2. Limited evaluation but I believe the supraspinatus and infraspinatus tendons are torn and in the glenoid fossa. The subscapularis and teres major tendons are intact and the teres major tendon is draped across the lower aspect of the glenoid. Electronically Signed   By: Rudie Meyer M.D.   On: 06/24/2018 17:29   Dg Shoulder Left  Result Date: 06/27/2018 CLINICAL DATA:  Closed reduction of the left shoulder EXAM: LEFT SHOULDER - 2+ VIEW; DG C-ARM 61-120 MIN COMPARISON:  Left shoulder radiographs-earlier same day FLUOROSCOPY TIME:  5 seconds FINDINGS: Two spot intraoperative fluoroscopic images of the left  glenohumeral joint are provided for review Images demonstrate apparent reduction of previously noted anterior inferior glenohumeral joint dislocation, though note a scapular Y radiograph was not provided. There is deformity involving the posterolateral aspect of the humeral head suggestive of a Hill-Sachs deformity. No radiopaque foreign body. IMPRESSION: Apparent reduction of previously noted anterior inferior left shoulder dislocation with residual Hill-Sachs deformity involving the humeral head. Further evaluation with dedicated left shoulder radiographic series could be performed as indicated. Electronically Signed   By: Simonne Come M.D.   On: 06/27/2018 13:43   Dg Shoulder Left  Result Date: 06/21/2018 CLINICAL DATA:  Left shoulder dislocation after fall today. Alzheimer's disease EXAM: LEFT SHOULDER - 2+ VIEW COMPARISON:  None. FINDINGS: There is anterior inferior dislocation of  the right humeral head with suspected glenoid. Do not see a definite bony Bankart fracture although there may well be a mild Hill-Sachs impaction. Spurring of the humeral head observed. Atherosclerotic calcification of the aortic arch. IMPRESSION: 1. Anterior inferior dislocation of the left shoulder. Possible Hill-Sachs impaction along the humeral head. 2.  Aortic Atherosclerosis (ICD10-I70.0). Electronically Signed   By: Gaylyn RongWalter  Liebkemann M.D.   On: 06/21/2018 20:52   Dg Shoulder Left Portable  Result Date: 06/21/2018 CLINICAL DATA:  Reduction of left shoulder. EXAM: LEFT SHOULDER - 1 VIEW COMPARISON:  06/21/2018 FINDINGS: Anterior subcoracoid humeral head dislocation is still present. No bony Bankart lesion. Stable appearance of the [facility] joint without dislocation. Atherosclerosis the adjacent aortic arch with ectasia. No acute osseous abnormality of the included ribs. IMPRESSION: Redemonstration of anterior subcoracoid humeral head dislocation after reduction attempt. Electronically Signed   By: Tollie Ethavid  Kwon M.D.   On:  06/21/2018 22:25   Dg C-arm 1-60 Min  Result Date: 06/27/2018 CLINICAL DATA:  Closed reduction of the left shoulder EXAM: LEFT SHOULDER - 2+ VIEW; DG C-ARM 61-120 MIN COMPARISON:  Left shoulder radiographs-earlier same day FLUOROSCOPY TIME:  5 seconds FINDINGS: Two spot intraoperative fluoroscopic images of the left glenohumeral joint are provided for review Images demonstrate apparent reduction of previously noted anterior inferior glenohumeral joint dislocation, though note a scapular Y radiograph was not provided. There is deformity involving the posterolateral aspect of the humeral head suggestive of a Hill-Sachs deformity. No radiopaque foreign body. IMPRESSION: Apparent reduction of previously noted anterior inferior left shoulder dislocation with residual Hill-Sachs deformity involving the humeral head. Further evaluation with dedicated left shoulder radiographic series could be performed as indicated. Electronically Signed   By: Simonne ComeJohn  Watts M.D.   On: 06/27/2018 13:43    Disposition:   Discharge Instructions    Call MD / Call 911   Complete by:  As directed    If you experience chest pain or shortness of breath, CALL 911 and be transported to the hospital emergency room.  If you develope a fever above 101 F, pus (white drainage) or increased drainage or redness at the wound, or calf pain, call your surgeon's office.   Constipation Prevention   Complete by:  As directed    Drink plenty of fluids.  Prune juice may be helpful.  You may use a stool softener, such as Colace (over the counter) 100 mg twice a day.  Use MiraLax (over the counter) for constipation as needed.   Diet - low sodium heart healthy   Complete by:  As directed    Discharge instructions   Complete by:  As directed    Keep arm in sling.  Return to clinic in 1 week.   Increase activity slowly as tolerated   Complete by:  As directed          Signed: Burnard BuntingG Scott Dean 07/03/2018, 11:45 AM

## 2018-07-05 ENCOUNTER — Ambulatory Visit (INDEPENDENT_AMBULATORY_CARE_PROVIDER_SITE_OTHER): Payer: Medicare Other | Admitting: Orthopedic Surgery

## 2018-07-05 ENCOUNTER — Encounter (INDEPENDENT_AMBULATORY_CARE_PROVIDER_SITE_OTHER): Payer: Self-pay | Admitting: Orthopedic Surgery

## 2018-07-05 DIAGNOSIS — S43006A Unspecified dislocation of unspecified shoulder joint, initial encounter: Secondary | ICD-10-CM

## 2018-07-05 NOTE — Progress Notes (Signed)
   Post-Op Visit Note   Patient: Misty Ray           Date of Birth: 1925-10-29           MRN: [id number] Visit Date: 07/05/2018 PCP: Zoila ShutterWoodyear, Wynne E, MD   Assessment & Plan:  Chief Complaint:  Chief Complaint  Patient presents with  . Left Shoulder - Routine Post Op    06/27/18 closed reduction   Visit Diagnoses:  1. Dislocation of shoulder joint     Plan: Para MarchJeanette is a patient who is now a week out left shoulder closed reduction.  She had a shoulder dislocation that was out for about 4 days.  Patient has dementia.  On examination the shoulder is located.  Cannot really tell if her axillary nerve is functioning.  I am going to have her stay in sling for 2 more weeks and then bring her back for clinical recheck in 2 weeks.  I may letter to come out of the sling a little bit more than she is doing now.  I think for now it is okay to come out of the sling once a day to twice a day for elbow range of motion only.  Follow-Up Instructions: Return in about 2 weeks (around 07/19/2018).   Orders:  No orders of the defined types were placed in this encounter.  No orders of the defined types were placed in this encounter.   Imaging: No results found.  PMFS History: Patient Active Problem List   Diagnosis Date Noted  . Traumatic closed displaced fracture of shoulder with anterior dislocation with routine healing    Past Medical History:  Diagnosis Date  . Blind   . Dementia    Alzheimer's  . Hearing loss   . Hypertension   . Pre-diabetes     History reviewed. No pertinent family history.  Past Surgical History:  Procedure Laterality Date  . ABDOMINAL HYSTERECTOMY    . CARPAL TUNNEL RELEASE Right   . hip sugery    . KNEE SURGERY    . SHOULDER CLOSED REDUCTION Left 06/27/2018   Procedure: LEFT SHOULDER CLOSED REDUCTION;  Surgeon: Cammy Copaean, Gregory Scott, MD;  [facility];  Service: Orthopedics;  Laterality: Left;   Social History   Occupational History  . Not on  file  Tobacco Use  . Smoking status: Former Games developermoker  . Smokeless tobacco: Never Used  Substance and Sexual Activity  . Alcohol use: Never    Frequency: Never  . Drug use: Never  . Sexual activity: Not on file

## 2018-07-19 ENCOUNTER — Encounter (INDEPENDENT_AMBULATORY_CARE_PROVIDER_SITE_OTHER): Payer: Self-pay | Admitting: Orthopedic Surgery

## 2018-07-19 ENCOUNTER — Ambulatory Visit (INDEPENDENT_AMBULATORY_CARE_PROVIDER_SITE_OTHER): Payer: Medicare Other | Admitting: Orthopedic Surgery

## 2018-07-19 DIAGNOSIS — S43006A Unspecified dislocation of unspecified shoulder joint, initial encounter: Secondary | ICD-10-CM

## 2018-07-23 ENCOUNTER — Encounter (INDEPENDENT_AMBULATORY_CARE_PROVIDER_SITE_OTHER): Payer: Self-pay | Admitting: Orthopedic Surgery

## 2018-07-23 NOTE — Progress Notes (Signed)
   Post-Op Visit Note   Patient: Misty Ray           Date of Birth: 09/18/1925           MRN: [id number] Visit Date: 07/19/2018 PCP: Zoila ShutterWoodyear, Wynne E, MD   Assessment & Plan:  Chief Complaint:  Chief Complaint  Patient presents with  . Left Shoulder - Routine Post Op   Visit Diagnoses:  1. Dislocation of shoulder joint     Plan: Patient presents following left shoulder dislocation reduction 06/21/2018.  Patient's been in a sling.  Patient has dementia but receives excellent care from both of her daughters.  Surgery was 06/27/2018.  On examination the shoulder is reduced.  Really difficult to assess if axillary nerve function has returned.  At this time we will keep her in the sling for 1 more week but that she did tear her supraspinatus and infraspinatus with that dislocation.  I think it is okay for shoulder range of motion to start after a week but below shoulder level.  I do not want a risk type of recurrent instability.  We will see her back as needed.  Follow-Up Instructions: Return if symptoms worsen or fail to improve.   Orders:  No orders of the defined types were placed in this encounter.  No orders of the defined types were placed in this encounter.   Imaging: No results found.  PMFS History: Patient Active Problem List   Diagnosis Date Noted  . Traumatic closed displaced fracture of shoulder with anterior dislocation with routine healing    Past Medical History:  Diagnosis Date  . Blind   . Dementia    Alzheimer's  . Hearing loss   . Hypertension   . Pre-diabetes     History reviewed. No pertinent family history.  Past Surgical History:  Procedure Laterality Date  . ABDOMINAL HYSTERECTOMY    . CARPAL TUNNEL RELEASE Right   . hip sugery    . KNEE SURGERY    . SHOULDER CLOSED REDUCTION Left 06/27/2018   Procedure: LEFT SHOULDER CLOSED REDUCTION;  Surgeon: Cammy Copaean, Eller Sweis Scott, MD;  [facility];  Service: Orthopedics;  Laterality: Left;    Social History   Occupational History  . Not on file  Tobacco Use  . Smoking status: Former Games developermoker  . Smokeless tobacco: Never Used  Substance and Sexual Activity  . Alcohol use: Never    Frequency: Never  . Drug use: Never  . Sexual activity: Not on file

## 2018-09-05 DEATH — deceased

## 2019-04-19 IMAGING — RF DG C-ARM 61-120 MIN
1 series · 2 of 2 positions shown · non-contrast
Comparison: Left shoulder radiographs-earlier same day

FLUOROSCOPY TIME:  5 seconds

CLINICAL DATA: Closed reduction of the left shoulder

EXAM:
LEFT SHOULDER - 2+ VIEW; DG C-ARM 61-120 MIN

[Series 1: run · 2 of 2 slices shown]
[im 1/2]
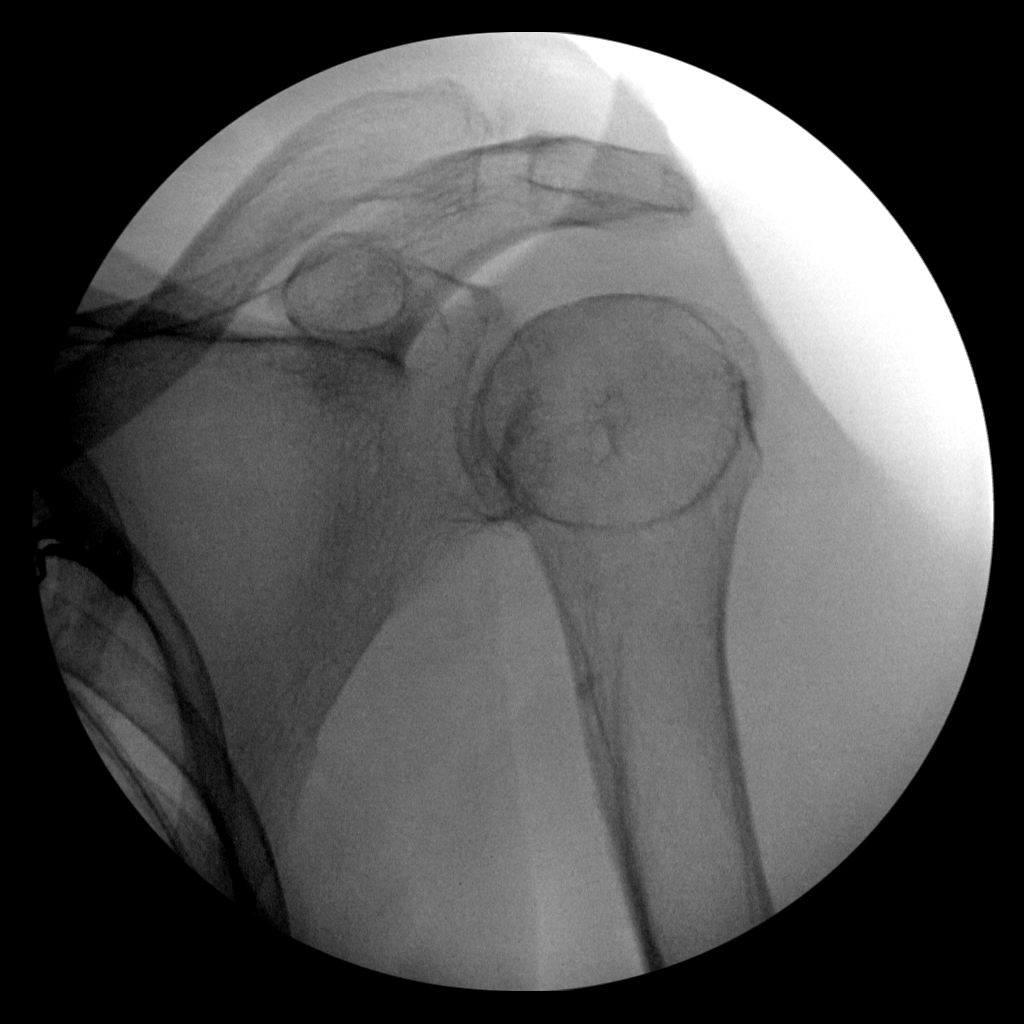
[im 2/2]
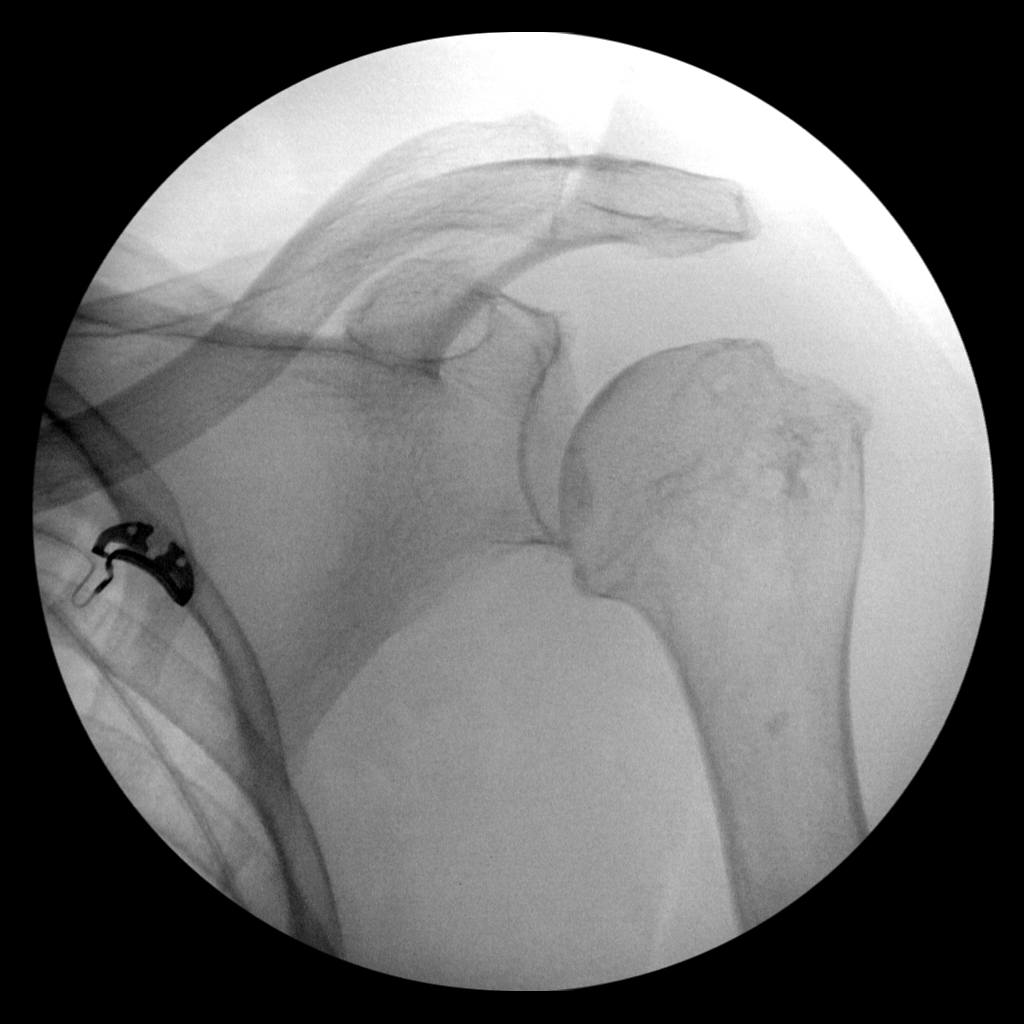

[2 of 2 positions shown; findings below may reference images not displayed]

FINDINGS: Two spot intraoperative fluoroscopic images of the left glenohumeral
joint are provided for review

Images demonstrate apparent reduction of previously noted anterior
inferior glenohumeral joint dislocation, though note a scapular Y
radiograph was not provided. There is deformity involving the
posterolateral aspect of the humeral head suggestive of a Hill-Sachs
deformity.

No radiopaque foreign body.
IMPRESSION: Apparent reduction of previously noted anterior inferior left
shoulder dislocation with residual Hill-Sachs deformity involving
the humeral head. Further evaluation with dedicated left shoulder
radiographic series could be performed as indicated.
